# Patient Record
Sex: Male | Born: 1960
Health system: Southern US, Community
[De-identification: ages and names within clinical notes are randomized; demographics above are authoritative.]

---

## 2017-06-05 ENCOUNTER — Ambulatory Visit
Admission: RE | Admit: 2017-06-05 | Discharge: 2017-06-05 | Disposition: A | Discharge status: Home / Self Care | Payer: BLUE CROSS/BLUE SHIELD | Source: Ambulatory Visit | Attending: Family Medicine | Admitting: Family Medicine

## 2017-06-05 ENCOUNTER — Other Ambulatory Visit: Payer: Self-pay | Admitting: Family Medicine

## 2017-06-05 DIAGNOSIS — R52 Pain, unspecified: Secondary | ICD-10-CM

## 2019-07-01 DIAGNOSIS — L300 Nummular dermatitis: Secondary | ICD-10-CM | POA: Diagnosis not present

## 2019-07-01 DIAGNOSIS — D485 Neoplasm of uncertain behavior of skin: Secondary | ICD-10-CM | POA: Diagnosis not present

## 2019-07-01 DIAGNOSIS — L570 Actinic keratosis: Secondary | ICD-10-CM | POA: Diagnosis not present

## 2019-08-09 IMAGING — CR DG FOOT COMPLETE 3+V*L*
3 series · 3 of 3 positions shown · non-contrast
Comparison: None.

CLINICAL DATA: Pain left first MTP joint 1 week

EXAM:
LEFT FOOT - COMPLETE 3+ VIEW

[t foot ap left]
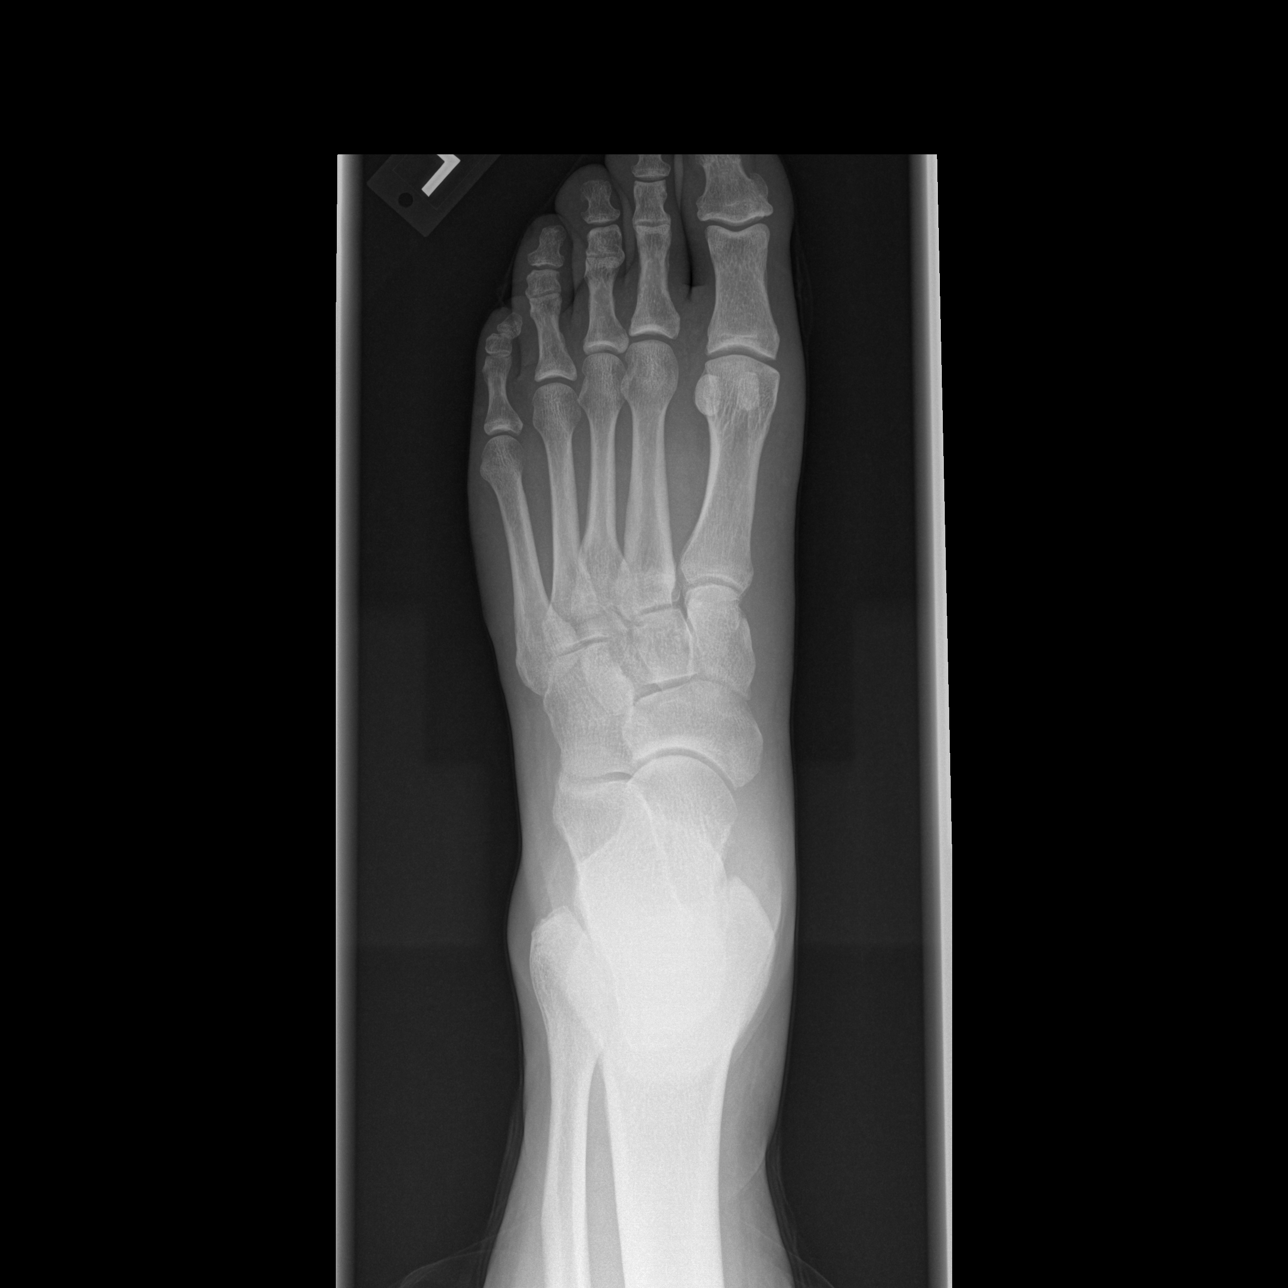

[t foot oblique left *]
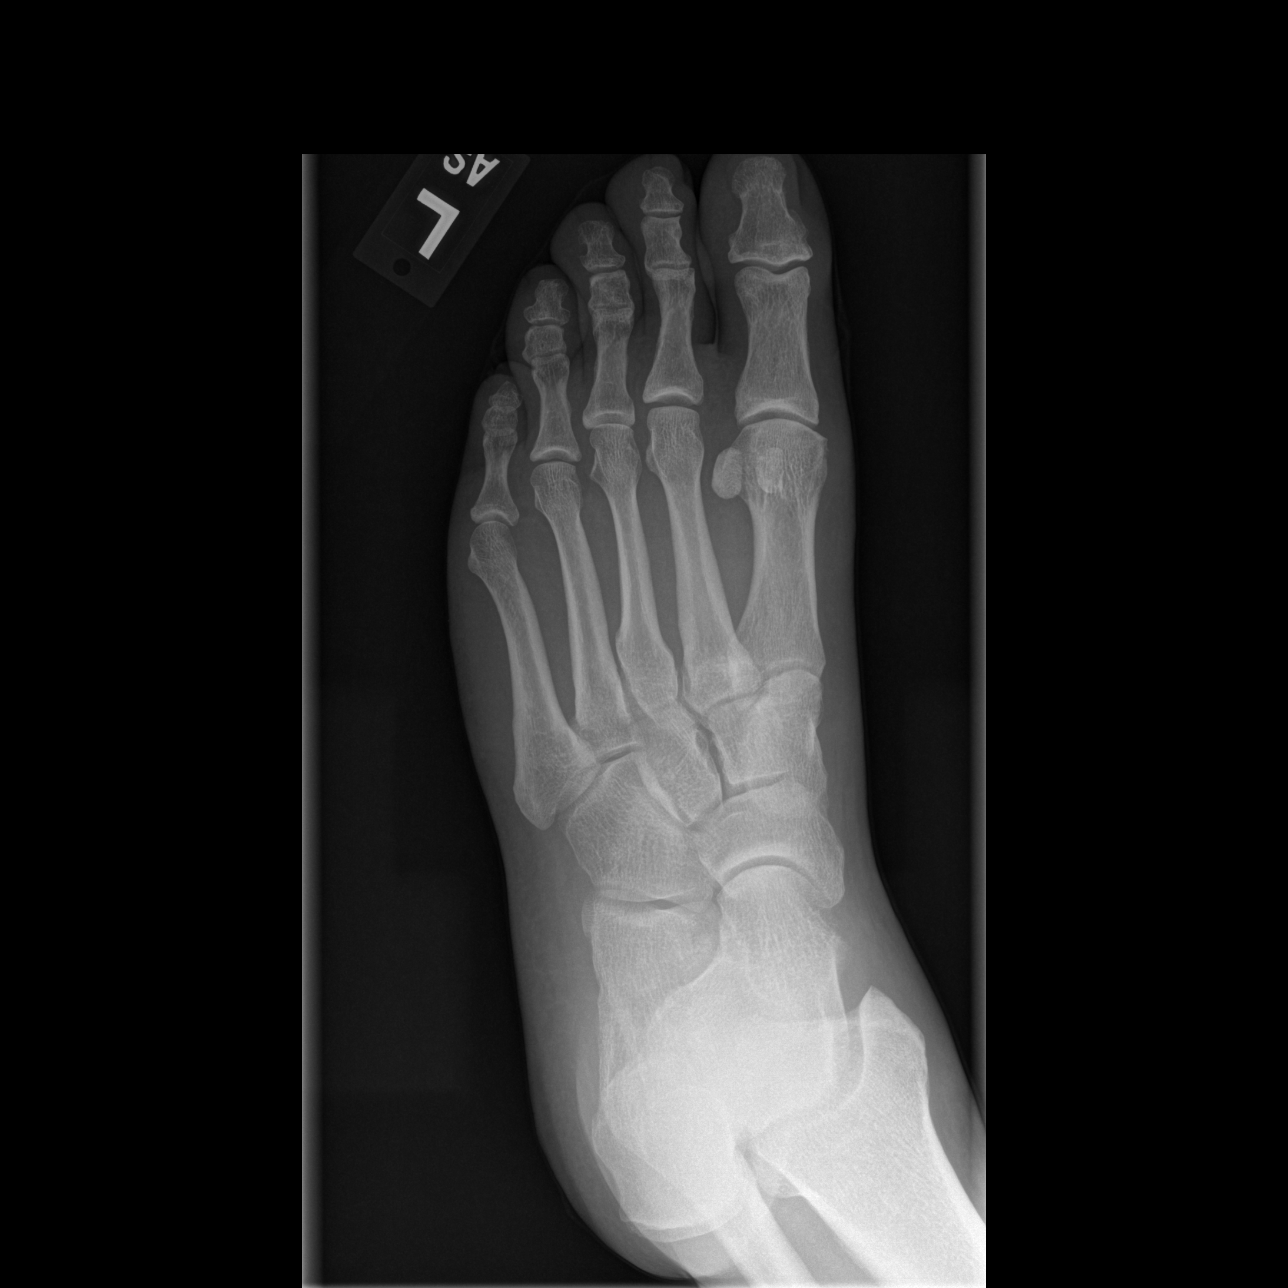

[t foot lat left *]
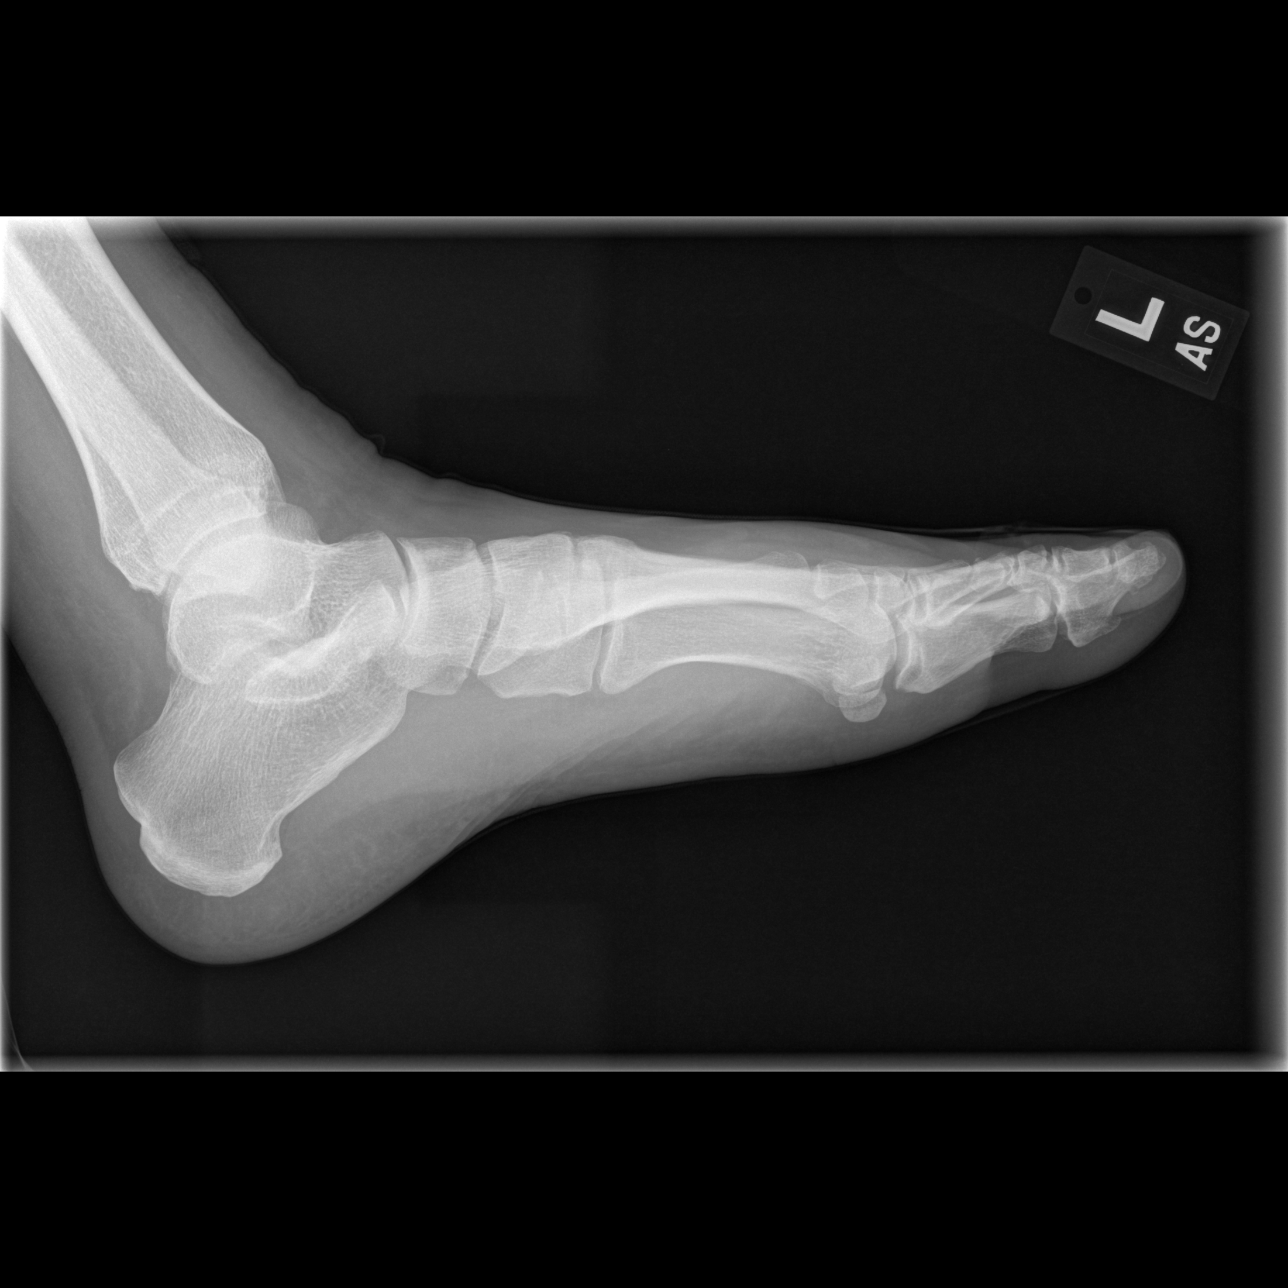

[3 of 3 positions shown; findings below may reference images not displayed]

FINDINGS: There is no evidence of fracture or dislocation. There is no
evidence of arthropathy or other focal bone abnormality. Soft
tissues are unremarkable.
IMPRESSION: Negative.

## 2020-02-13 DIAGNOSIS — U071 COVID-19: Secondary | ICD-10-CM | POA: Diagnosis not present

## 2020-02-13 DIAGNOSIS — R059 Cough, unspecified: Secondary | ICD-10-CM | POA: Diagnosis not present

## 2020-02-14 ENCOUNTER — Telehealth (HOSPITAL_COMMUNITY): Payer: Self-pay | Admitting: Emergency Medicine

## 2020-02-14 ENCOUNTER — Other Ambulatory Visit: Payer: Self-pay | Admitting: Nurse Practitioner

## 2020-02-14 DIAGNOSIS — U071 COVID-19: Secondary | ICD-10-CM

## 2020-02-14 NOTE — Telephone Encounter (Signed)
Called pt and explained possible monoclonal antibody treatment. Sx started 11/21. Tested positive 11/24 with home test and 11/26 PCR at [facility]. Sx include fatigue, fever, headache, and sinus pressure. Qualifying risk factors include past history of tobacco use for 7 years. Pt interested in tx. Informed pt an APP will call back to possibly schedule an appointment. Pt is unvaccinated.

## 2020-02-14 NOTE — Progress Notes (Signed)
I connected by phone with Paul Diaz on 02/14/2020 at 3:05 PM to discuss the potential use of a new treatment for mild to moderate COVID-19 viral infection in non-hospitalized patients.  This patient is a 59 y.o. male that meets the FDA criteria for Emergency Use Authorization of COVID monoclonal antibody casirivimab/imdevimab, bamlanivimab/eteseviamb, or sotrovimab.  Has a (+) direct SARS-CoV-2 viral test result  Has mild or moderate COVID-19   Is NOT hospitalized due to COVID-19  Is within 10 days of symptom onset  Has at least one of the high risk factor(s) for progression to severe COVID-19 and/or hospitalization as defined in EUA.  Specific high risk criteria : BMI > 25 and Other high risk medical condition per CDC:  smoker   I have spoken and communicated the following to the patient or parent/caregiver regarding COVID monoclonal antibody treatment:  1. FDA has authorized the emergency use for the treatment of mild to moderate COVID-19 in adults and pediatric patients with positive results of direct SARS-CoV-2 viral testing who are 43 years of age and older weighing at least 40 kg, and who are at high risk for progressing to severe COVID-19 and/or hospitalization.  2. The significant known and potential risks and benefits of COVID monoclonal antibody, and the extent to which such potential risks and benefits are unknown.  3. Information on available alternative treatments and the risks and benefits of those alternatives, including clinical trials.  4. Patients treated with COVID monoclonal antibody should continue to self-isolate and use infection control measures (e.g., wear mask, isolate, social distance, avoid sharing personal items, clean and disinfect "high touch" surfaces, and frequent handwashing) according to CDC guidelines.   5. The patient or parent/caregiver has the option to accept or refuse COVID monoclonal antibody treatment.  After reviewing this information  with the patient, the patient has agreed to receive one of the available covid 19 monoclonal antibodies and will be provided an appropriate fact sheet prior to infusion. Mayra Reel, NP 02/14/2020 3:05 PM

## 2020-02-15 ENCOUNTER — Ambulatory Visit (HOSPITAL_COMMUNITY)
Admission: RE | Admit: 2020-02-15 | Discharge: 2020-02-15 | Disposition: A | Discharge status: Home / Self Care | Payer: BC Managed Care – PPO | Source: Ambulatory Visit | Attending: Pulmonary Disease | Admitting: Pulmonary Disease

## 2020-02-15 DIAGNOSIS — F17200 Nicotine dependence, unspecified, uncomplicated: Secondary | ICD-10-CM | POA: Diagnosis not present

## 2020-02-15 DIAGNOSIS — U071 COVID-19: Secondary | ICD-10-CM | POA: Insufficient documentation

## 2020-02-15 DIAGNOSIS — Z6825 Body mass index (BMI) 25.0-25.9, adult: Secondary | ICD-10-CM | POA: Diagnosis not present

## 2020-02-15 MED ORDER — METHYLPREDNISOLONE SODIUM SUCC 125 MG IJ SOLR: 125.0000 mg | Freq: Once | INTRAMUSCULAR | Status: DC | PRN

## 2020-02-15 MED ORDER — ACETAMINOPHEN 325 MG PO TABS
650.0000 mg | ORAL_TABLET | Freq: Once | ORAL | Status: AC
  Administered 2020-02-15: 650 mg via ORAL
  Filled 2020-02-15: qty 2

## 2020-02-15 MED ORDER — DIPHENHYDRAMINE HCL 50 MG/ML IJ SOLN: 50.0000 mg | Freq: Once | INTRAMUSCULAR | Status: DC | PRN

## 2020-02-15 MED ORDER — SODIUM CHLORIDE 0.9 % IV SOLN: INTRAVENOUS | Status: DC | PRN

## 2020-02-15 MED ORDER — SOTROVIMAB 500 MG/8ML IV SOLN
500.0000 mg | Freq: Once | INTRAVENOUS | Status: AC
  Administered 2020-02-15: 500 mg via INTRAVENOUS

## 2020-02-15 MED ORDER — FAMOTIDINE IN NACL 20-0.9 MG/50ML-% IV SOLN: 20.0000 mg | Freq: Once | INTRAVENOUS | Status: DC | PRN

## 2020-02-15 MED ORDER — ALBUTEROL SULFATE HFA 108 (90 BASE) MCG/ACT IN AERS: 2.0000 | INHALATION_SPRAY | Freq: Once | RESPIRATORY_TRACT | Status: DC | PRN

## 2020-02-15 MED ORDER — EPINEPHRINE 0.3 MG/0.3ML IJ SOAJ: 0.3000 mg | Freq: Once | INTRAMUSCULAR | Status: DC | PRN

## 2020-02-15 NOTE — Discharge Instructions (Signed)
10 Things You Can Do to Manage Your COVID-19 Symptoms at Home If you have possible or confirmed COVID-19: 1. Stay home from work and school. And stay away from other public places. If you must go out, avoid using any kind of public transportation, ridesharing, or taxis. 2. Monitor your symptoms carefully. If your symptoms get worse, call your healthcare provider immediately. 3. Get rest and stay hydrated. 4. If you have a medical appointment, call the healthcare provider ahead of time and tell them that you have or may have COVID-19. 5. For medical emergencies, call 911 and notify the dispatch personnel that you have or may have COVID-19. 6. Cover your cough and sneezes with a tissue or use the inside of your elbow. 7. Wash your hands often with soap and water for at least 20 seconds or clean your hands with an alcohol-based hand sanitizer that contains at least 60% alcohol. 8. As much as possible, stay in a specific room and away from other people in your home. Also, you should use a separate bathroom, if available. If you need to be around other people in or outside of the home, wear a mask. 9. Avoid sharing personal items with other people in your household, like dishes, towels, and bedding. 10. Clean all surfaces that are touched often, like counters, tabletops, and doorknobs. Use household cleaning sprays or wipes according to the label instructions. cdc.gov/coronavirus 09/16/2018 This information is not intended to replace advice given to you by your health care provider. Make sure you discuss any questions you have with your health care provider. Document Revised: 02/18/2019 Document Reviewed: 02/18/2019 Elsevier Patient Education  2020 [employer]  What types of side effects do monoclonal antibody drugs cause?  Common side effects  In general, the more common side effects caused by monoclonal antibody drugs include: . Allergic reactions, such as hives or itching . Flu-like signs and  symptoms, including chills, fatigue, fever, and muscle aches and pains . Nausea, vomiting . Diarrhea . Skin rashes . Low blood pressure   The CDC is recommending patients who receive monoclonal antibody treatments wait at least 90 days before being vaccinated.  Currently, there are no data on the safety and efficacy of mRNA COVID-19 vaccines in persons who received monoclonal antibodies or convalescent plasma as part of COVID-19 treatment. Based on the estimated half-life of such therapies as well as evidence suggesting that reinfection is uncommon in the 90 days after initial infection, vaccination should be deferred for at least 90 days, as a precautionary measure until additional information becomes available, to avoid interference of the antibody treatment with vaccine-induced immune responses.  If you have any questions or concerns after the infusion please call the Advanced Practice Provider on call at [phone]. This number is only intended for your use regarding questions or concerns about the infusion post-treatment side-effects.  Please do not provide this number to others for use.   If someone you know is interested in receiving treatment please have them call the COVID hotline at [phone].   

## 2020-02-15 NOTE — Progress Notes (Signed)
Patient reviewed Fact Sheet for Patients, Parents, and Caregivers for Emergency Use Authorization (EUA) of Sotrovimab for the Treatment of Coronavirus. Patient also reviewed and is agreeable to the estimated cost of treatment. Patient is agreeable to proceed.   

## 2020-02-15 NOTE — Progress Notes (Signed)
Diagnosis: COVID-19  Physician: Dr. Patrick Wright  Procedure: [facility] Med: Sotrovimab infusion - Provided patient with sotrovimab fact sheet for patients, parents, and caregivers prior to infusion.   Complications: No immediate complications noted  Discharge: Discharged home    

## 2020-07-13 DIAGNOSIS — L570 Actinic keratosis: Secondary | ICD-10-CM | POA: Diagnosis not present

## 2020-07-13 DIAGNOSIS — D1801 Hemangioma of skin and subcutaneous tissue: Secondary | ICD-10-CM | POA: Diagnosis not present

## 2020-07-13 DIAGNOSIS — L821 Other seborrheic keratosis: Secondary | ICD-10-CM | POA: Diagnosis not present

## 2020-07-13 DIAGNOSIS — D225 Melanocytic nevi of trunk: Secondary | ICD-10-CM | POA: Diagnosis not present

## 2020-07-13 DIAGNOSIS — L814 Other melanin hyperpigmentation: Secondary | ICD-10-CM | POA: Diagnosis not present

## 2020-09-01 DIAGNOSIS — Z125 Encounter for screening for malignant neoplasm of prostate: Secondary | ICD-10-CM | POA: Diagnosis not present

## 2020-09-01 DIAGNOSIS — Z0000 Encounter for general adult medical examination without abnormal findings: Secondary | ICD-10-CM | POA: Diagnosis not present

## 2020-09-01 DIAGNOSIS — Z1211 Encounter for screening for malignant neoplasm of colon: Secondary | ICD-10-CM | POA: Diagnosis not present

## 2020-11-23 DIAGNOSIS — Z1211 Encounter for screening for malignant neoplasm of colon: Secondary | ICD-10-CM | POA: Diagnosis not present

## 2020-11-23 DIAGNOSIS — Z86010 Personal history of colonic polyps: Secondary | ICD-10-CM | POA: Diagnosis not present

## 2020-11-23 DIAGNOSIS — K5730 Diverticulosis of large intestine without perforation or abscess without bleeding: Secondary | ICD-10-CM | POA: Diagnosis not present

## 2020-11-23 DIAGNOSIS — K635 Polyp of colon: Secondary | ICD-10-CM | POA: Diagnosis not present

## 2020-11-23 DIAGNOSIS — K644 Residual hemorrhoidal skin tags: Secondary | ICD-10-CM | POA: Diagnosis not present

## 2020-11-23 DIAGNOSIS — Z8719 Personal history of other diseases of the digestive system: Secondary | ICD-10-CM | POA: Diagnosis not present

## 2020-11-23 DIAGNOSIS — K649 Unspecified hemorrhoids: Secondary | ICD-10-CM | POA: Diagnosis not present

## 2020-12-25 DIAGNOSIS — D692 Other nonthrombocytopenic purpura: Secondary | ICD-10-CM | POA: Diagnosis not present

## 2020-12-25 DIAGNOSIS — L820 Inflamed seborrheic keratosis: Secondary | ICD-10-CM | POA: Diagnosis not present

## 2020-12-25 DIAGNOSIS — L821 Other seborrheic keratosis: Secondary | ICD-10-CM | POA: Diagnosis not present

## 2021-07-19 DIAGNOSIS — L821 Other seborrheic keratosis: Secondary | ICD-10-CM | POA: Diagnosis not present

## 2021-07-19 DIAGNOSIS — D225 Melanocytic nevi of trunk: Secondary | ICD-10-CM | POA: Diagnosis not present

## 2021-07-19 DIAGNOSIS — L814 Other melanin hyperpigmentation: Secondary | ICD-10-CM | POA: Diagnosis not present

## 2021-07-19 DIAGNOSIS — D1801 Hemangioma of skin and subcutaneous tissue: Secondary | ICD-10-CM | POA: Diagnosis not present

## 2021-07-19 DIAGNOSIS — L820 Inflamed seborrheic keratosis: Secondary | ICD-10-CM | POA: Diagnosis not present

## 2021-09-03 DIAGNOSIS — Z0000 Encounter for general adult medical examination without abnormal findings: Secondary | ICD-10-CM | POA: Diagnosis not present

## 2021-09-03 DIAGNOSIS — Z125 Encounter for screening for malignant neoplasm of prostate: Secondary | ICD-10-CM | POA: Diagnosis not present

## 2021-09-03 DIAGNOSIS — Z13220 Encounter for screening for lipoid disorders: Secondary | ICD-10-CM | POA: Diagnosis not present

## 2022-07-25 DIAGNOSIS — L814 Other melanin hyperpigmentation: Secondary | ICD-10-CM | POA: Diagnosis not present

## 2022-07-25 DIAGNOSIS — L821 Other seborrheic keratosis: Secondary | ICD-10-CM | POA: Diagnosis not present

## 2022-07-25 DIAGNOSIS — D1801 Hemangioma of skin and subcutaneous tissue: Secondary | ICD-10-CM | POA: Diagnosis not present

## 2022-07-25 DIAGNOSIS — L570 Actinic keratosis: Secondary | ICD-10-CM | POA: Diagnosis not present

## 2022-07-25 DIAGNOSIS — D485 Neoplasm of uncertain behavior of skin: Secondary | ICD-10-CM | POA: Diagnosis not present

## 2022-11-29 DIAGNOSIS — Z1211 Encounter for screening for malignant neoplasm of colon: Secondary | ICD-10-CM | POA: Diagnosis not present

## 2022-11-29 DIAGNOSIS — Z13220 Encounter for screening for lipoid disorders: Secondary | ICD-10-CM | POA: Diagnosis not present

## 2022-11-29 DIAGNOSIS — Z0000 Encounter for general adult medical examination without abnormal findings: Secondary | ICD-10-CM | POA: Diagnosis not present

## 2022-11-29 DIAGNOSIS — Z125 Encounter for screening for malignant neoplasm of prostate: Secondary | ICD-10-CM | POA: Diagnosis not present

## 2022-11-29 DIAGNOSIS — Z23 Encounter for immunization: Secondary | ICD-10-CM | POA: Diagnosis not present

## 2023-08-04 DIAGNOSIS — D1801 Hemangioma of skin and subcutaneous tissue: Secondary | ICD-10-CM | POA: Diagnosis not present

## 2023-08-04 DIAGNOSIS — L570 Actinic keratosis: Secondary | ICD-10-CM | POA: Diagnosis not present

## 2023-08-04 DIAGNOSIS — C44319 Basal cell carcinoma of skin of other parts of face: Secondary | ICD-10-CM | POA: Diagnosis not present

## 2023-08-04 DIAGNOSIS — L821 Other seborrheic keratosis: Secondary | ICD-10-CM | POA: Diagnosis not present

## 2023-08-04 DIAGNOSIS — L814 Other melanin hyperpigmentation: Secondary | ICD-10-CM | POA: Diagnosis not present

## 2023-08-18 DIAGNOSIS — C44319 Basal cell carcinoma of skin of other parts of face: Secondary | ICD-10-CM | POA: Diagnosis not present

## 2024-04-21 ENCOUNTER — Encounter (HOSPITAL_BASED_OUTPATIENT_CLINIC_OR_DEPARTMENT_OTHER): Payer: Self-pay | Admitting: Pulmonary Disease

## 2024-04-21 DIAGNOSIS — G4733 Obstructive sleep apnea (adult) (pediatric): Secondary | ICD-10-CM
